# Patient Record
Sex: Male | Born: 1955 | Race: White | Hispanic: No | Smoking: Current every day smoker
Health system: Southern US, Community
[De-identification: ages and names within clinical notes are randomized; demographics above are authoritative.]

## PROBLEM LIST (undated history)

## (undated) DIAGNOSIS — E785 Hyperlipidemia, unspecified: Secondary | ICD-10-CM

## (undated) DIAGNOSIS — I2510 Atherosclerotic heart disease of native coronary artery without angina pectoris: Secondary | ICD-10-CM

## (undated) HISTORY — DX: Atherosclerotic heart disease of native coronary artery without angina pectoris: I25.10

## (undated) HISTORY — DX: Hyperlipidemia, unspecified: E78.5

## (undated) HISTORY — PX: TONSILLECTOMY AND ADENOIDECTOMY: SUR1326

---

## 2008-10-06 HISTORY — PX: HAND SURGERY: SHX662

## 2012-10-06 HISTORY — PX: CORONARY ARTERY BYPASS GRAFT: SHX141

## 2015-02-27 ENCOUNTER — Ambulatory Visit: Payer: Self-pay | Admitting: Internal Medicine

## 2015-03-07 ENCOUNTER — Telehealth: Payer: Self-pay | Admitting: Cardiology

## 2015-03-07 NOTE — Telephone Encounter (Signed)
Received records from [facility] for appointment with Dr Antoine PocheHochrein on 04/20/15.  [facility] ([facility] for Dr Hochrein's schedule on 04/20/15. lp

## 2015-04-20 ENCOUNTER — Ambulatory Visit (INDEPENDENT_AMBULATORY_CARE_PROVIDER_SITE_OTHER): Payer: BLUE CROSS/BLUE SHIELD | Admitting: Cardiology

## 2015-04-20 ENCOUNTER — Encounter: Payer: Self-pay | Admitting: Cardiology

## 2015-04-20 VITALS — BP 136/74 | HR 75 | Ht 73.0 in | Wt 226.0 lb

## 2015-04-20 DIAGNOSIS — Z951 Presence of aortocoronary bypass graft: Secondary | ICD-10-CM | POA: Diagnosis not present

## 2015-04-20 DIAGNOSIS — I2510 Atherosclerotic heart disease of native coronary artery without angina pectoris: Secondary | ICD-10-CM | POA: Diagnosis not present

## 2015-04-20 DIAGNOSIS — I25810 Atherosclerosis of coronary artery bypass graft(s) without angina pectoris: Secondary | ICD-10-CM | POA: Insufficient documentation

## 2015-04-20 MED ORDER — EZETIMIBE 10 MG PO TABS: 10.0000 mg | ORAL_TABLET | Freq: Every day | ORAL | Status: AC

## 2015-04-20 NOTE — Patient Instructions (Signed)
Your physician wants you to follow-up in: 18 Months. You will receive a reminder letter in the mail two months in advance. If you don't receive a letter, please call our office to schedule the follow-up appointment.  Your physician has requested that you have an exercise tolerance test. For further information please visit [email or web address] Please also follow instruction sheet, as given.  Your physician has recommended you make the following change in your medication: START Zetia 10 mg daily

## 2015-04-20 NOTE — Progress Notes (Signed)
Cardiology Office Note   Date:  04/20/2015   ID:  Rani Sisney, DOB 12-Mar-1956, MRN [id number]  PCP:  Gwen Pounds, MD  Cardiologist:   Rollene Rotunda, MD   Chief Complaint  Patient presents with  . Coronary Artery Disease      History of Present Illness: Roy Phelps is a 59 y.o. male who presents for evaluation of coronary disease. He has a history of CABG. 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 He reports that he had some vague symptoms when he was living in the [place]. This subsequently led to a diagnosis of coronary disease and CABG. He's not had any testing since then. He actually denies any prior myocardial infarction. He denies any cardiovascular symptoms. He says he is very active and can put 100 pound tarp on his back and carry it up a ladder.  The patient denies any new symptoms such as chest discomfort, neck or arm discomfort. There has been no new shortness of breath, PND or orthopnea. There have been no reported palpitations, presyncope or syncope.  Past Medical History  Diagnosis Date  . CAD (coronary artery disease)     90% left main February 2014  . Hyperlipemia     Past Surgical History  Procedure Laterality Date  . Coronary artery bypass graft  2014    3 vessel (no details)  . Hand surgery    . Tonsillectomy and adenoidectomy       Current Outpatient Prescriptions  Medication Sig Dispense Refill  . aspirin EC 81 MG tablet Take 81 mg by mouth daily.    . CRESTOR 40 MG tablet Take 40 mg by mouth daily.  11  . ezetimibe (ZETIA) 10 MG tablet Take 1 tablet (10 mg total) by mouth daily. 30 tablet 6   No current facility-administered medications for this visit.    Allergies:   Review of patient's allergies indicates no known allergies.    Social History:  The patient  reports that he has been smoking Cigarettes.  He has a 92.5 pack-year smoking history. He does not have any smokeless tobacco history on file.   Family History:  The  patient's family history includes CAD (age of onset: 63) in his mother; Colon cancer in his mother; Hypertension in his mother; Lung cancer in his brother; Skin cancer in his brother.    ROS:  Please see the history of present illness.   Otherwise, review of systems are positive for constipation.   All other systems are reviewed and negative.    PHYSICAL EXAM: VS:  BP 136/74 mmHg  Pulse 75  Ht  (1.854 m)  Wt 226 lb (102.513 kg)  BMI 29.82 kg/m2 , BMI Body mass index is 29.82 kg/(m^2). GENERAL:  Well appearing HEENT:  Pupils equal round and reactive, fundi not visualized, oral mucosa unremarkable, edentulous NECK:  No jugular venous distention, waveform within normal limits, carotid upstroke brisk and symmetric, no bruits, no thyromegaly LYMPHATICS:  No cervical, inguinal adenopathy LUNGS:  Clear to auscultation bilaterally BACK:  No CVA tenderness CHEST:  Well healed sternotomy scar. HEART:  PMI not displaced or sustained,S1 and S2 within normal limits, no S3, no S4, no clicks, no rubs, no murmurs ABD:  Flat, positive bowel sounds normal in frequency in pitch, no bruits, no rebound, no guarding, no midline pulsatile mass, no hepatomegaly, no splenomegaly EXT:  2 plus pulses throughout, no edema, no cyanosis no clubbing SKIN:  No rashes no nodules NEURO:  Cranial  nerves II through XII grossly intact, motor grossly intact throughout [facility]:  Cognitively intact, oriented to person place and time    EKG:  EKG is ordered today. The ekg ordered today demonstrates sinus rhythm, rate 75, axis within normal limits, intervals within normal limits, premature ventricular contractions.   Recent Labs: No results found for requested labs within last 365 days.    Lipid Panel No results found for: CHOL, TRIG, HDL, CHOLHDL, VLDL, LDLCALC, LDLDIRECT    Wt Readings from Last 3 Encounters:  04/20/15 226 lb (102.513 kg)      Other studies Reviewed: Additional studies/ records that were  reviewed today include: Office records. Review of the above records demonstrates:  Please see elsewhere in the note.     ASSESSMENT AND PLAN:  CAD:  The patient has no ongoing symptoms. 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  TOBACCO ABUSE:  He has no intention of quitting cigarettes and we talked about this.  DYSLIPIDEMIA:   I reviewed the records from his primary office. Despite the Zetia 40 mg his most recent LDL was 129. I agree that he would Zetia he was given a prescription for this.  LUNG NODULE:  I discussed this with him. This is mentioned in Dr. Ferd Hibbsusso's notes.  He will further discuss this with him.  Current medicines are reviewed at length with the patient today.  The patient does not have concerns regarding medicines.  The following changes have been made:  As above  Labs/ tests ordered today include:   Orders Placed This Encounter  Procedures  . Exercise Tolerance Test  . EKG 12-Lead     Disposition:   FU with me in 18 months.     Signed, Rollene RotundaJames Reynolds Kittel, MD  04/20/2015 2:16 PM    [facility]

## 2015-04-23 ENCOUNTER — Telehealth (HOSPITAL_COMMUNITY): Payer: Self-pay | Admitting: *Deleted

## 2015-06-06 ENCOUNTER — Telehealth (HOSPITAL_COMMUNITY): Payer: Self-pay | Admitting: Radiology

## 2015-06-06 NOTE — Telephone Encounter (Signed)
Encounter complete. 

## 2015-06-07 ENCOUNTER — Telehealth (HOSPITAL_COMMUNITY): Payer: Self-pay | Admitting: Radiology

## 2015-06-07 NOTE — Telephone Encounter (Signed)
Encounter complete. 

## 2015-06-08 ENCOUNTER — Ambulatory Visit (HOSPITAL_COMMUNITY)
Admission: RE | Admit: 2015-06-08 | Discharge: 2015-06-08 | Disposition: A | Discharge status: Home / Self Care | Payer: BLUE CROSS/BLUE SHIELD | Source: Ambulatory Visit | Attending: Cardiology | Admitting: Cardiology

## 2015-06-08 DIAGNOSIS — I2510 Atherosclerotic heart disease of native coronary artery without angina pectoris: Secondary | ICD-10-CM | POA: Diagnosis not present

## 2015-06-08 DIAGNOSIS — Z951 Presence of aortocoronary bypass graft: Secondary | ICD-10-CM | POA: Diagnosis not present

## 2015-06-08 LAB — EXERCISE TOLERANCE TEST
CHL CUP MPHR: 161 {beats}/min
CHL CUP RESTING HR STRESS: 69 {beats}/min
CSEPED: 3 min
CSEPEDS: 38 s
CSEPEW: 5.3 METS
CSEPPHR: 101 {beats}/min
Percent HR: 62 %
RPE: 16

## 2015-06-12 ENCOUNTER — Telehealth: Payer: Self-pay | Admitting: *Deleted

## 2015-06-12 DIAGNOSIS — I25810 Atherosclerosis of coronary artery bypass graft(s) without angina pectoris: Secondary | ICD-10-CM

## 2015-06-12 NOTE — Telephone Encounter (Signed)
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  Call Mr. Corporan with the results and send results to Gwen Pounds, MD

## 2015-06-12 NOTE — Telephone Encounter (Signed)
Spoke with pt about his stress test, placed order for a lexiscan Myoview, pt stated he wouldn't be about to do it until next month, message send to scheduler  Result send to pt PCP

## 2015-07-04 ENCOUNTER — Telehealth (HOSPITAL_COMMUNITY): Payer: Self-pay

## 2015-07-04 NOTE — Telephone Encounter (Signed)
Encounter complete. 

## 2015-07-06 ENCOUNTER — Ambulatory Visit (HOSPITAL_COMMUNITY)
Admission: RE | Admit: 2015-07-06 | Discharge: 2015-07-06 | Disposition: A | Discharge status: Home / Self Care | Payer: BLUE CROSS/BLUE SHIELD | Source: Ambulatory Visit | Attending: Cardiology | Admitting: Cardiology

## 2015-07-06 ENCOUNTER — Encounter: Payer: Self-pay | Admitting: *Deleted

## 2015-07-06 DIAGNOSIS — R0609 Other forms of dyspnea: Secondary | ICD-10-CM | POA: Diagnosis not present

## 2015-07-06 DIAGNOSIS — I25810 Atherosclerosis of coronary artery bypass graft(s) without angina pectoris: Secondary | ICD-10-CM | POA: Diagnosis not present

## 2015-07-06 DIAGNOSIS — I517 Cardiomegaly: Secondary | ICD-10-CM | POA: Insufficient documentation

## 2015-07-06 DIAGNOSIS — Z8249 Family history of ischemic heart disease and other diseases of the circulatory system: Secondary | ICD-10-CM | POA: Diagnosis not present

## 2015-07-06 DIAGNOSIS — F17200 Nicotine dependence, unspecified, uncomplicated: Secondary | ICD-10-CM | POA: Diagnosis not present

## 2015-07-06 DIAGNOSIS — R9439 Abnormal result of other cardiovascular function study: Secondary | ICD-10-CM | POA: Diagnosis not present

## 2015-07-06 LAB — MYOCARDIAL PERFUSION IMAGING
CHL CUP NUCLEAR SDS: 0
CHL CUP NUCLEAR SRS: 3
CHL CUP RESTING HR STRESS: 65 {beats}/min
LV sys vol: 80 mL
LVDIAVOL: 151 mL
NUC STRESS TID: 1.31
Peak HR: 93 {beats}/min
SSS: 3

## 2015-07-06 MED ORDER — TECHNETIUM TC 99M SESTAMIBI GENERIC - CARDIOLITE
10.9000 | Freq: Once | INTRAVENOUS | Status: AC | PRN
  Administered 2015-07-06: 10.9 via INTRAVENOUS

## 2015-07-06 MED ORDER — TECHNETIUM TC 99M SESTAMIBI GENERIC - CARDIOLITE
30.8000 | Freq: Once | INTRAVENOUS | Status: AC | PRN
  Administered 2015-07-06: 30.8 via INTRAVENOUS

## 2015-07-06 MED ORDER — AMINOPHYLLINE 25 MG/ML IV SOLN
75.0000 mg | Freq: Once | INTRAVENOUS | Status: AC
  Administered 2015-07-06: 75 mg via INTRAVENOUS

## 2015-07-06 MED ORDER — REGADENOSON 0.4 MG/5ML IV SOLN
0.4000 mg | Freq: Once | INTRAVENOUS | Status: AC
  Administered 2015-07-06: 0.4 mg via INTRAVENOUS

## 2015-07-17 ENCOUNTER — Telehealth: Payer: Self-pay | Admitting: Cardiology

## 2015-07-17 NOTE — Telephone Encounter (Signed)
Results routed to Dr. Timothy Lasso.

## 2015-07-17 NOTE — Telephone Encounter (Signed)
Pt would like his stress test results from 07-06-15 please. Please call and send it by e-mail if possible.

## 2015-07-17 NOTE — Telephone Encounter (Signed)
Goes to VM - left message to call back.

## 2015-07-18 NOTE — Telephone Encounter (Signed)
Pt aware of results, mailed to patient.

## 2016-01-31 ENCOUNTER — Ambulatory Visit
Admission: RE | Admit: 2016-01-31 | Discharge: 2016-01-31 | Disposition: A | Discharge status: Home / Self Care | Payer: Worker's Compensation | Source: Ambulatory Visit | Attending: Family | Admitting: Family

## 2016-01-31 ENCOUNTER — Other Ambulatory Visit: Payer: Self-pay | Admitting: Family

## 2016-01-31 DIAGNOSIS — M25561 Pain in right knee: Secondary | ICD-10-CM

## 2016-01-31 DIAGNOSIS — M79671 Pain in right foot: Secondary | ICD-10-CM | POA: Insufficient documentation

## 2016-01-31 DIAGNOSIS — M25571 Pain in right ankle and joints of right foot: Secondary | ICD-10-CM | POA: Diagnosis present

## 2016-01-31 DIAGNOSIS — M7989 Other specified soft tissue disorders: Secondary | ICD-10-CM | POA: Insufficient documentation

## 2016-01-31 DIAGNOSIS — M79604 Pain in right leg: Secondary | ICD-10-CM

## 2016-01-31 DIAGNOSIS — M79661 Pain in right lower leg: Secondary | ICD-10-CM | POA: Insufficient documentation

## 2016-01-31 DIAGNOSIS — X501XXA Overexertion from prolonged static or awkward postures, initial encounter: Secondary | ICD-10-CM | POA: Insufficient documentation

## 2017-06-14 IMAGING — CR DG TIBIA/FIBULA 2V*R*
1 series · 4 of 4 positions shown · non-contrast
Comparison: None.

CLINICAL DATA: Fall, pain in the right knee to the right foot,
initial encounter.

EXAM:
RIGHT TIBIA AND FIBULA - 2 VIEW

[Series 1: dg tibia/fibula right · 0.14mm/px · 4 of 4 slices shown]
[im 1/4]
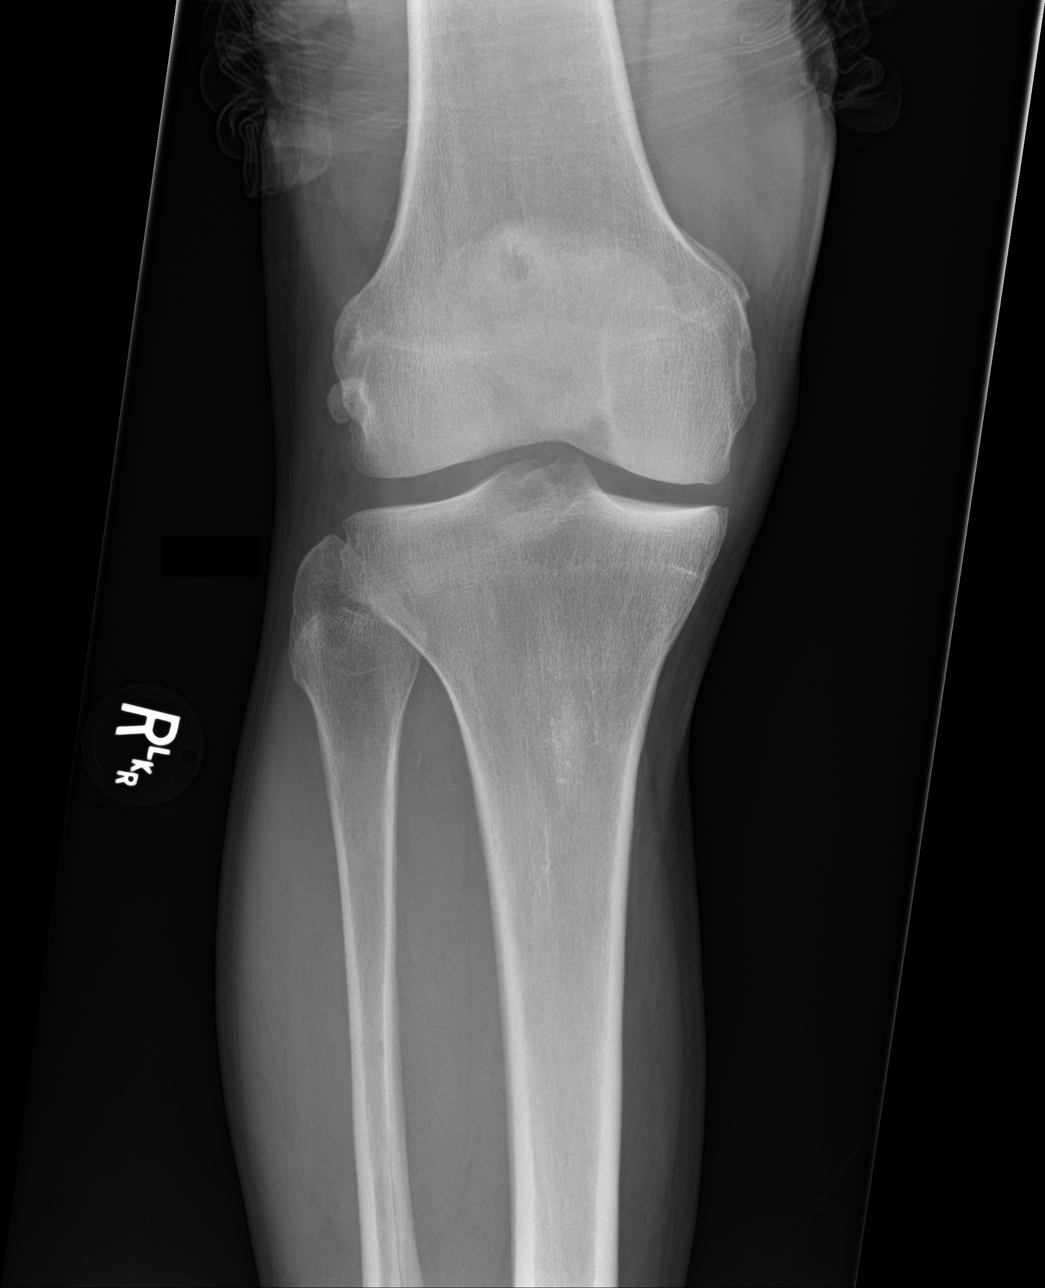
[im 2/4]
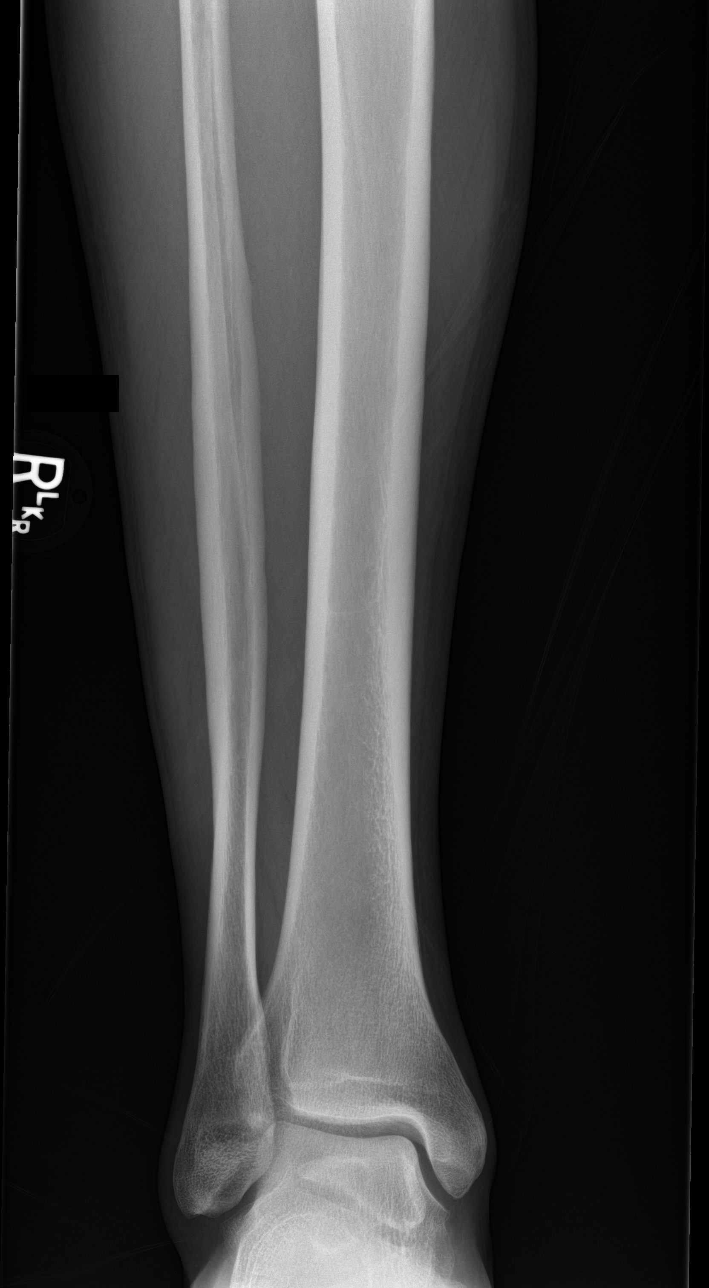
[im 3/4]
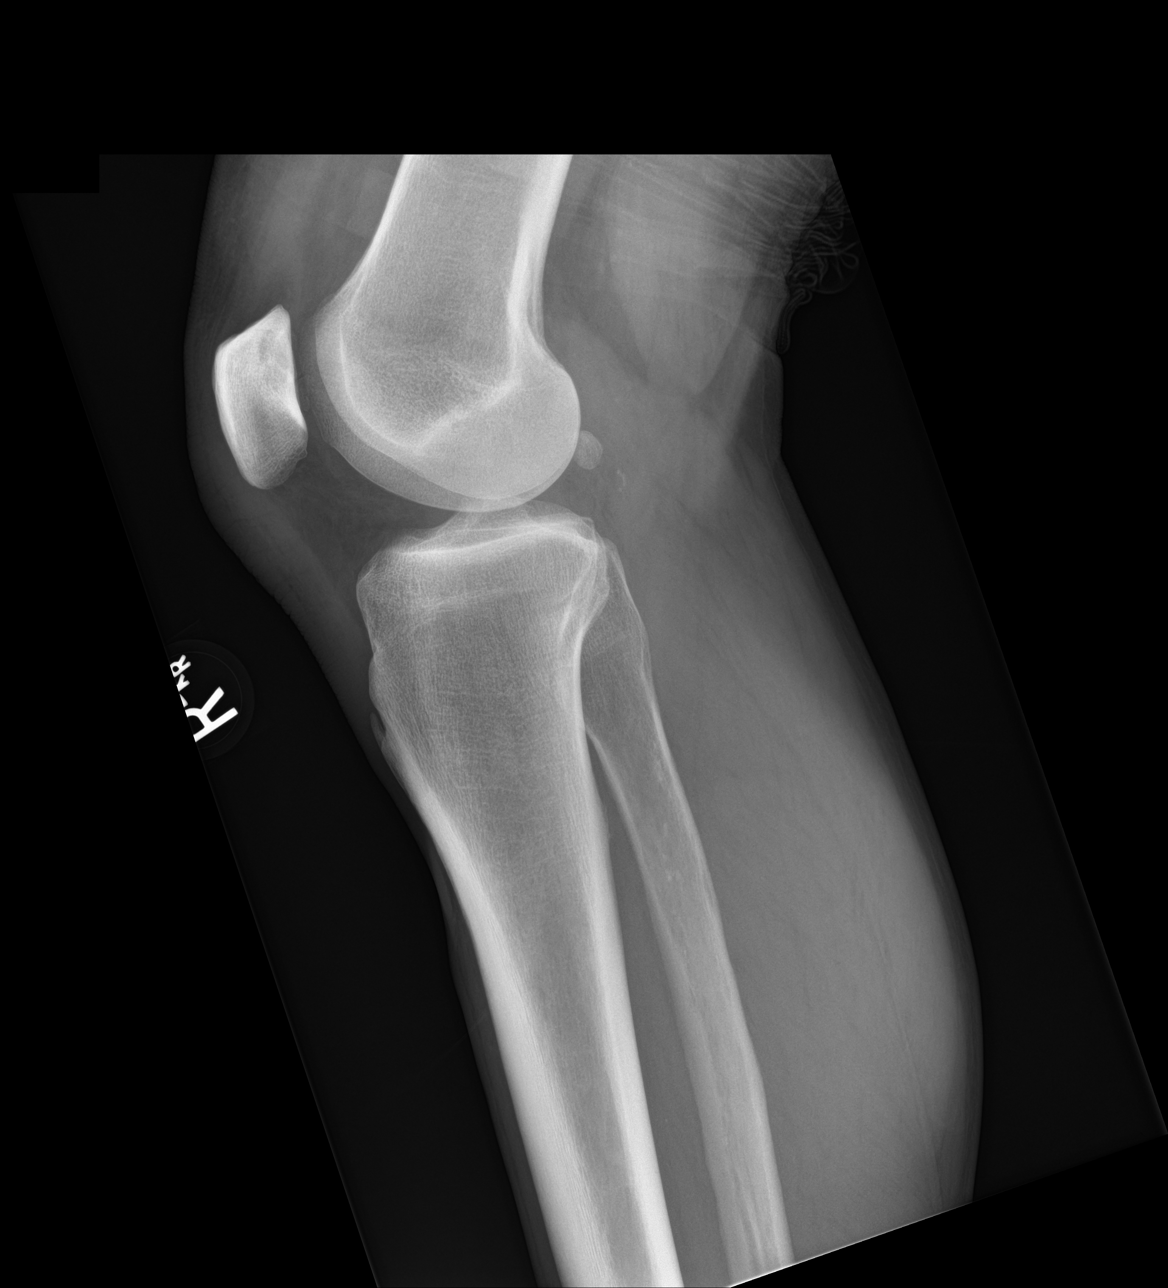
[im 4/4]
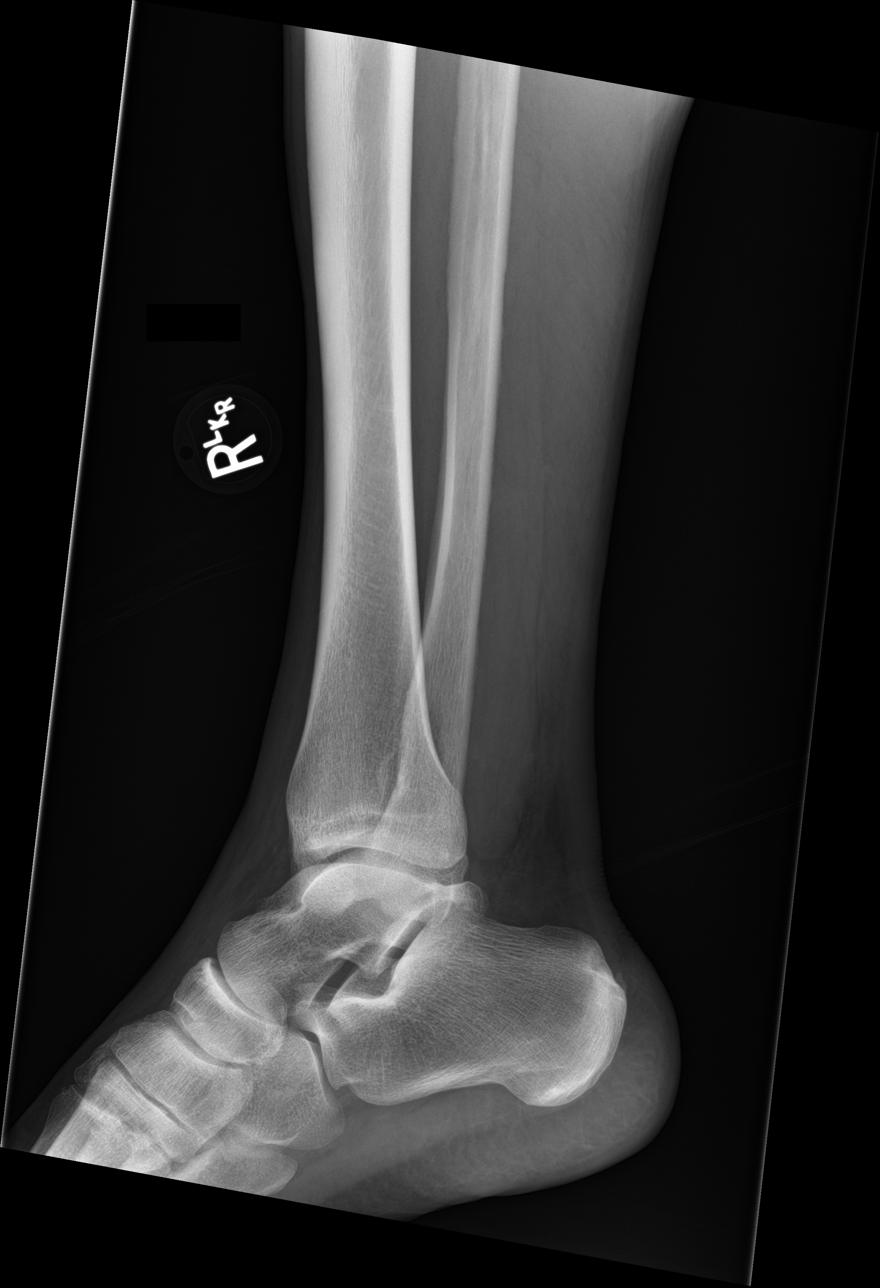

[4 of 4 positions shown; findings below may reference images not displayed]

FINDINGS: No joint effusion or fracture. Prepatellar soft tissue swelling. No
degenerative changes.
IMPRESSION: Prepatellar soft tissue swelling.  No fracture.

## 2017-06-14 IMAGING — CR DG FOOT COMPLETE 3+V*R*
1 series · 3 of 3 positions shown · non-contrast
Comparison: None.

CLINICAL DATA: Fall, pain in the right knee to the right foot,
initial encounter.

EXAM:
RIGHT FOOT COMPLETE - 3+ VIEW

[Series 1: dg foot complete right · 0.14mm/px · 3 of 3 slices shown]
[im 1/3]
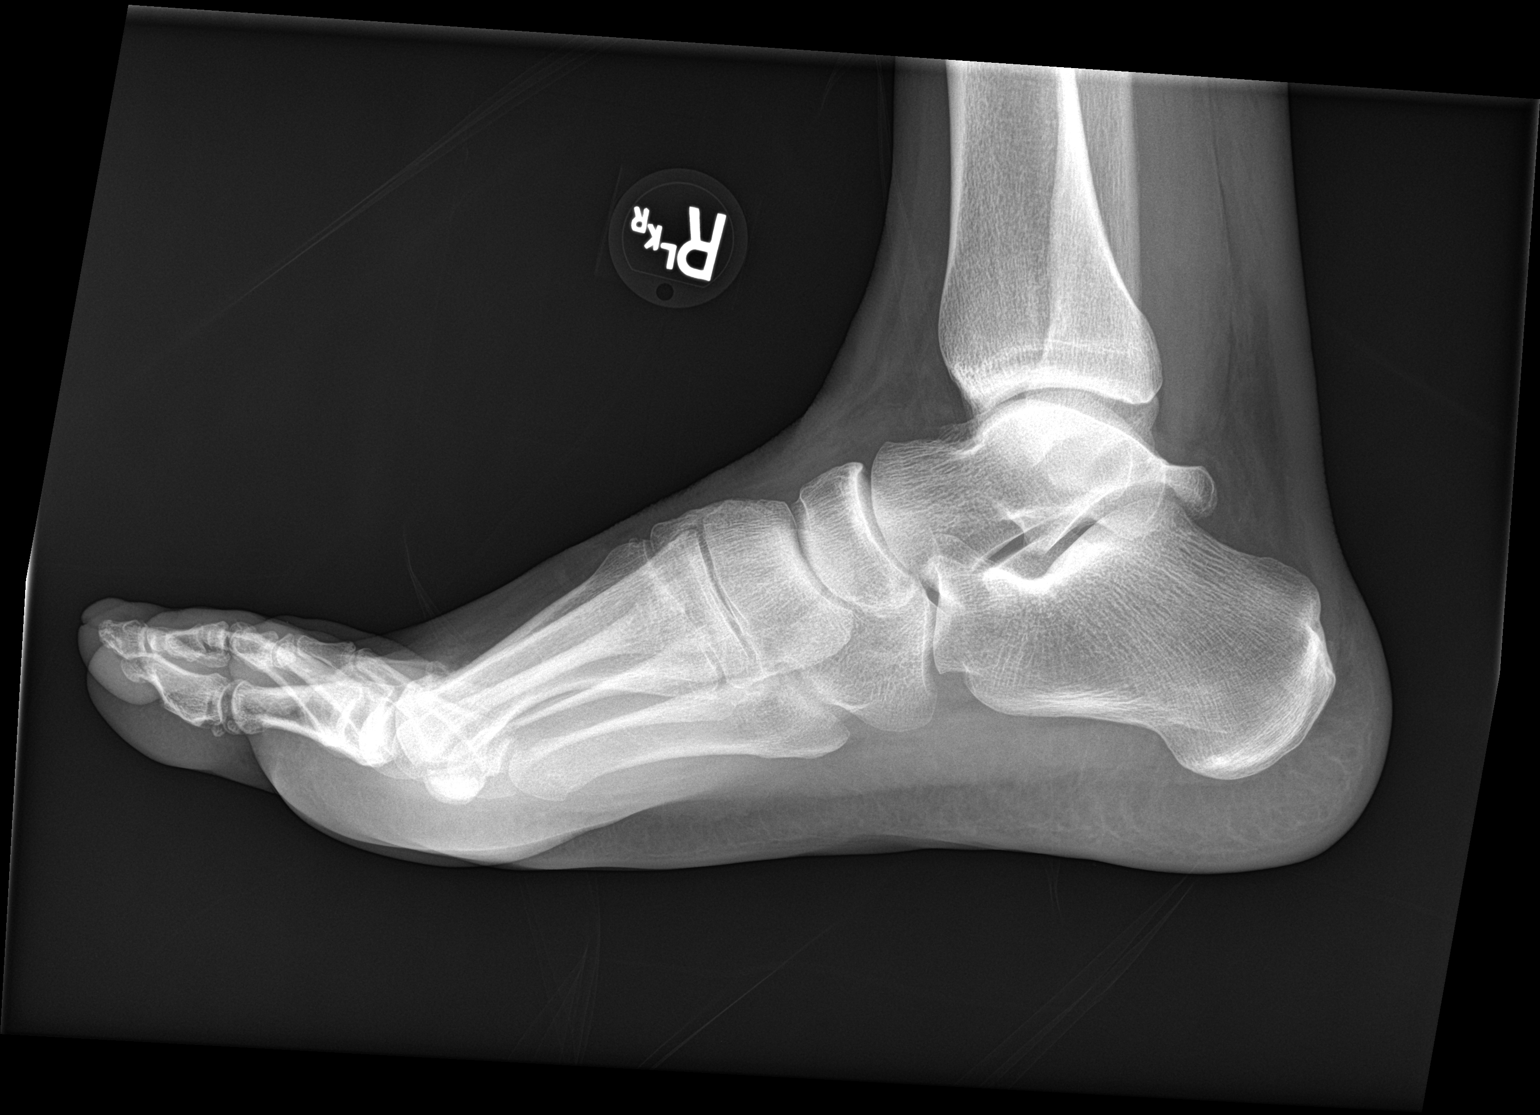
[im 2/3]
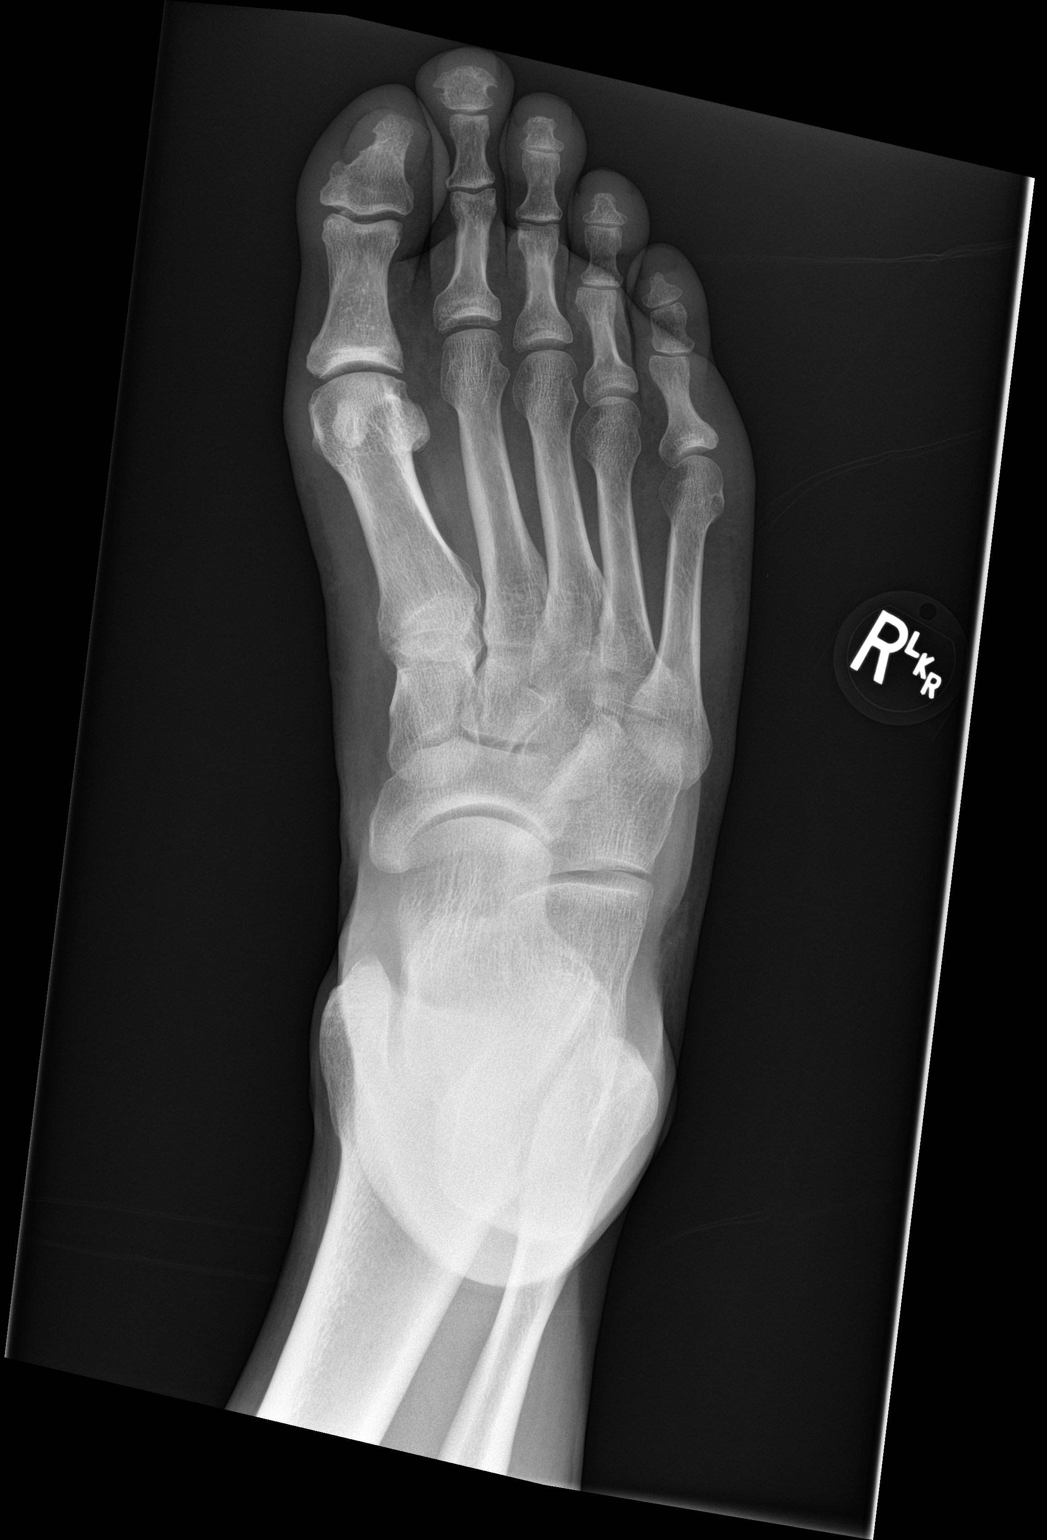
[im 3/3]
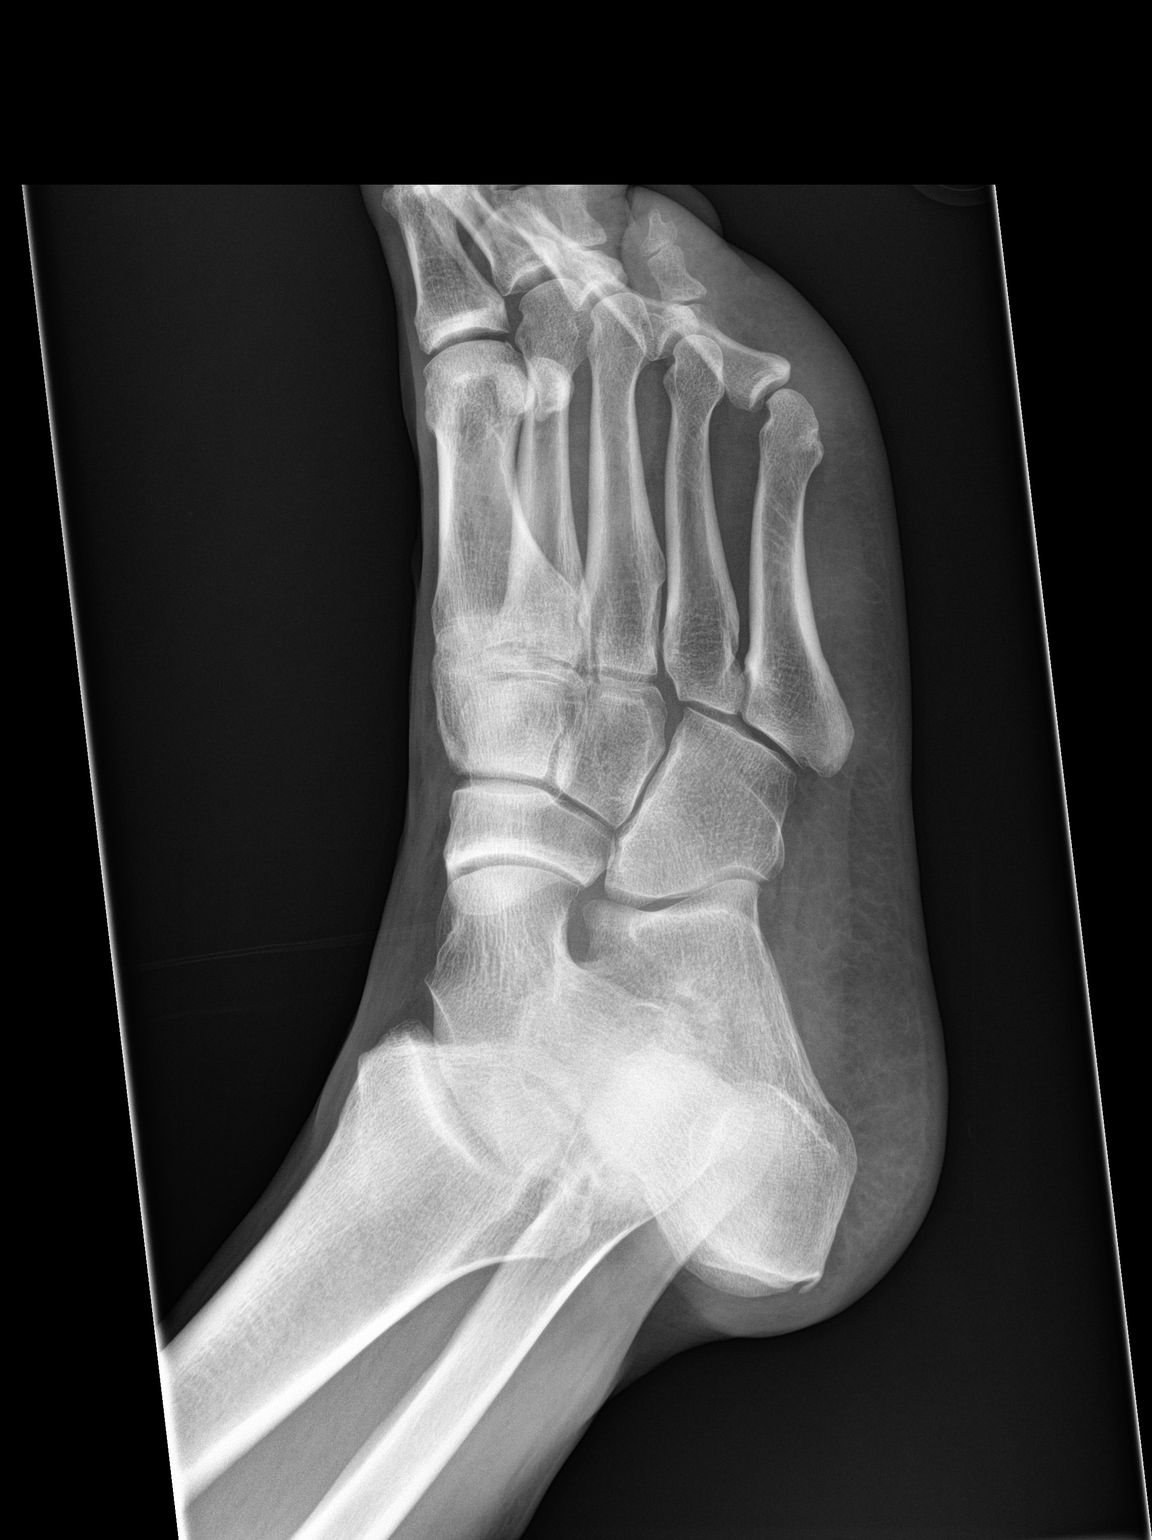

[3 of 3 positions shown; findings below may reference images not displayed]

FINDINGS: Mild hallux valgus.  No acute osseous or joint abnormality.
IMPRESSION: No acute osseous abnormality.

## 2017-06-14 IMAGING — CR DG KNEE COMPLETE 4+V*R*
1 series · 4 of 4 positions shown · non-contrast
Comparison: None.

CLINICAL DATA: Fell off a trailer.  Right knee pain.

EXAM:
RIGHT KNEE - COMPLETE 4+ VIEW

[Series 1: dg knee complete 4 views right · 0.14mm/px · 4 of 4 slices shown]
[im 1/4]
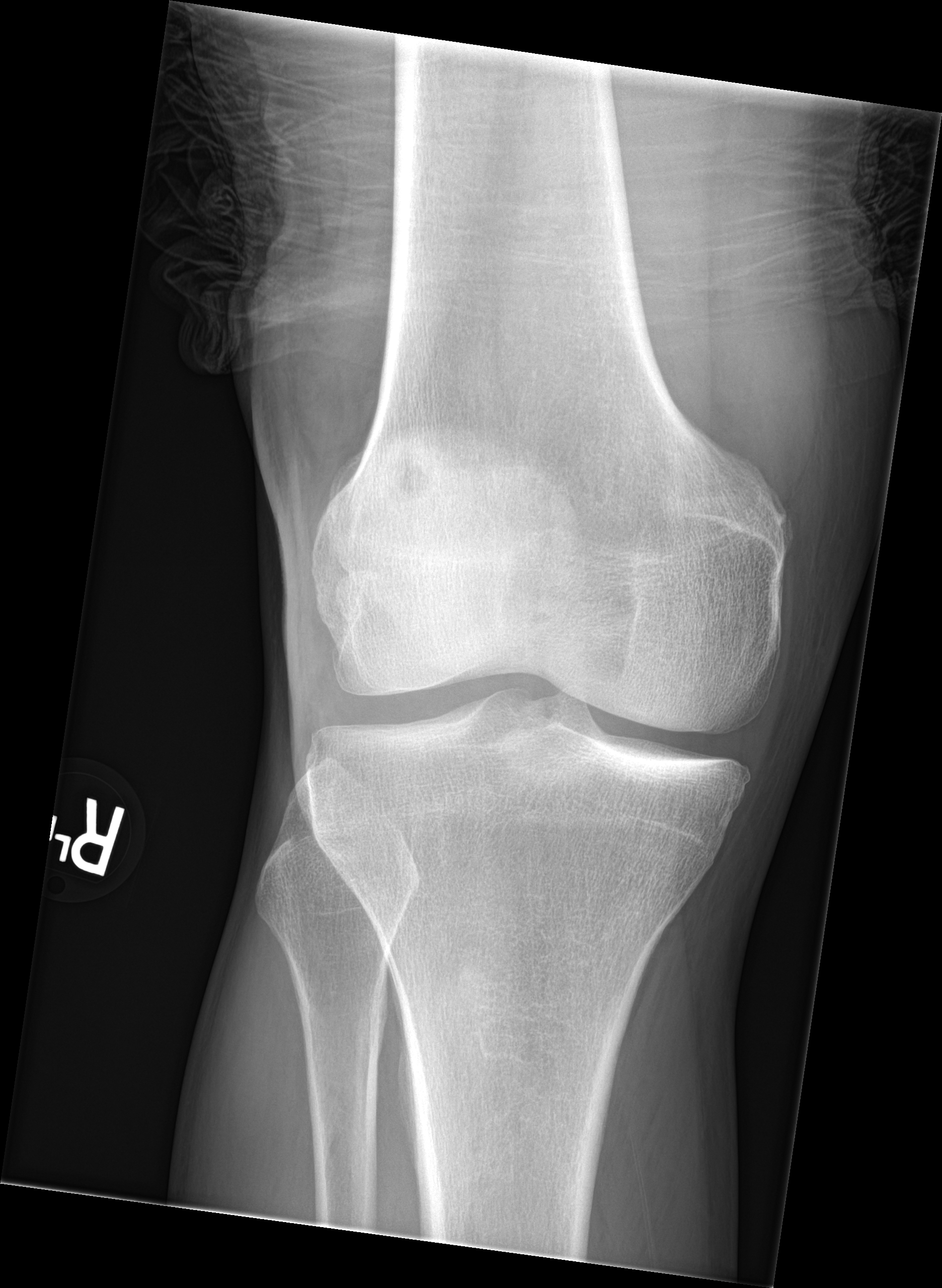
[im 2/4]
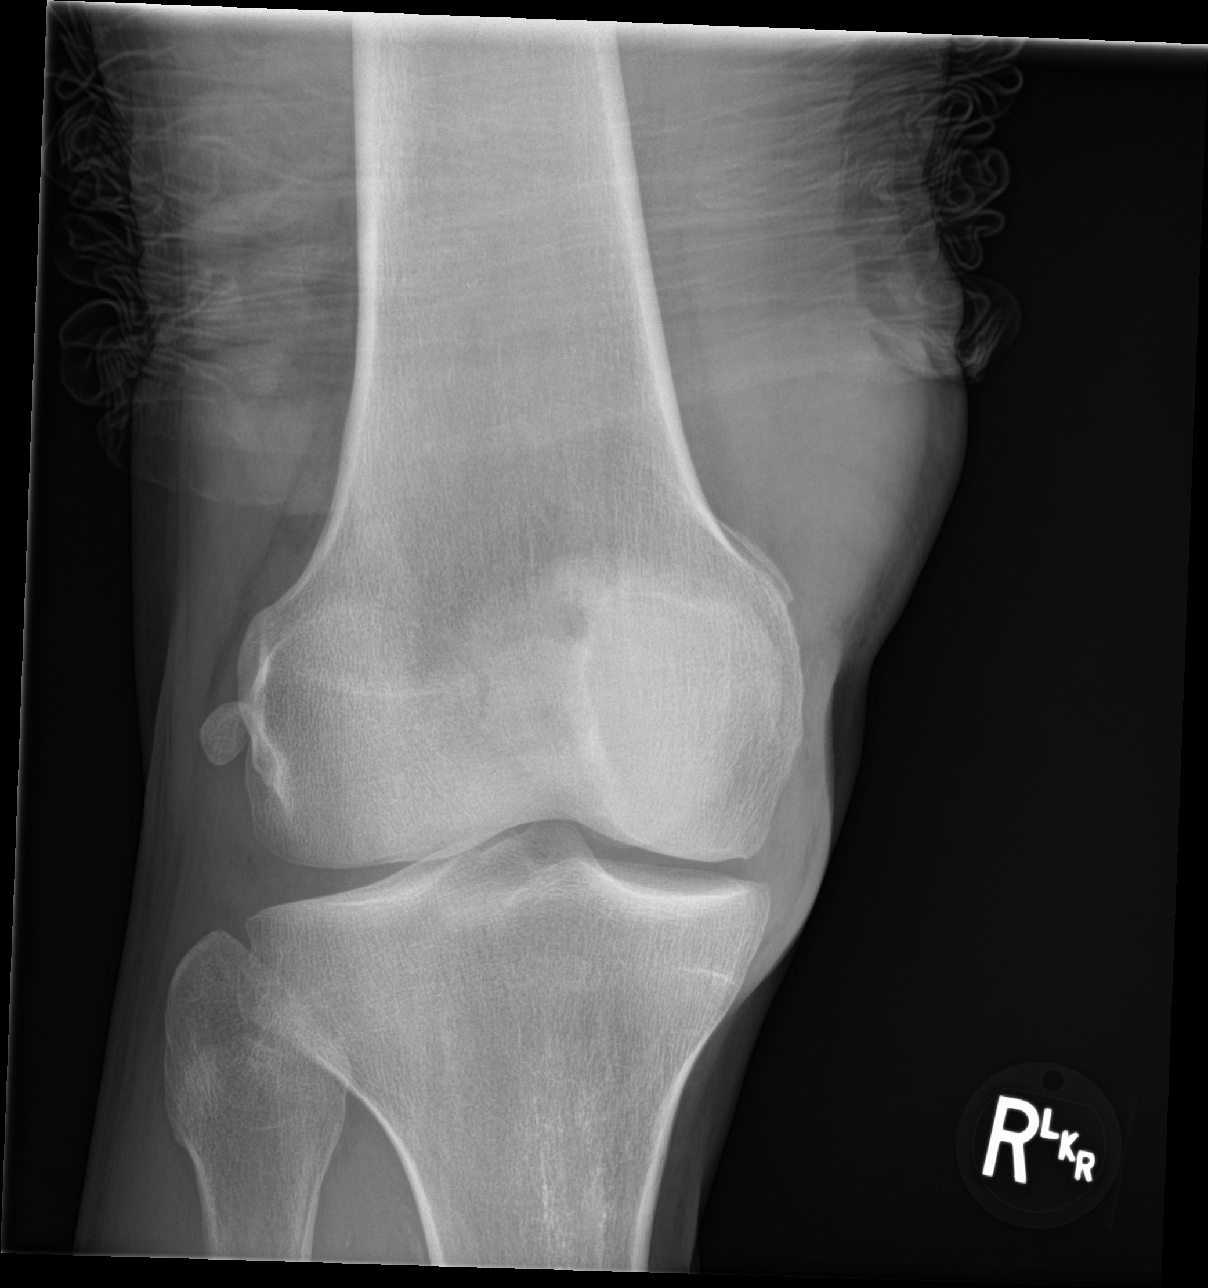
[im 3/4]
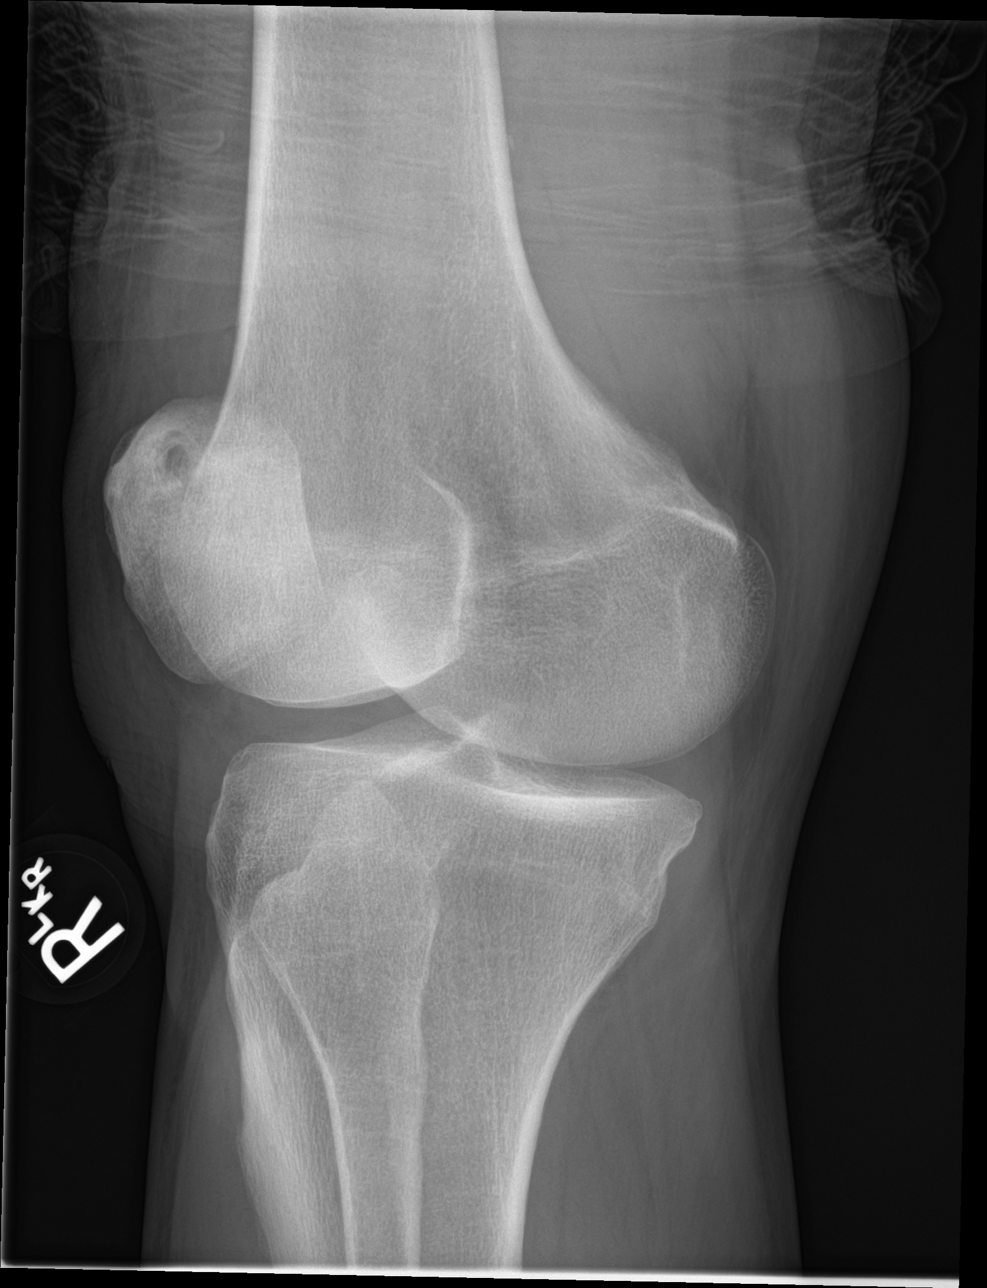
[im 4/4]
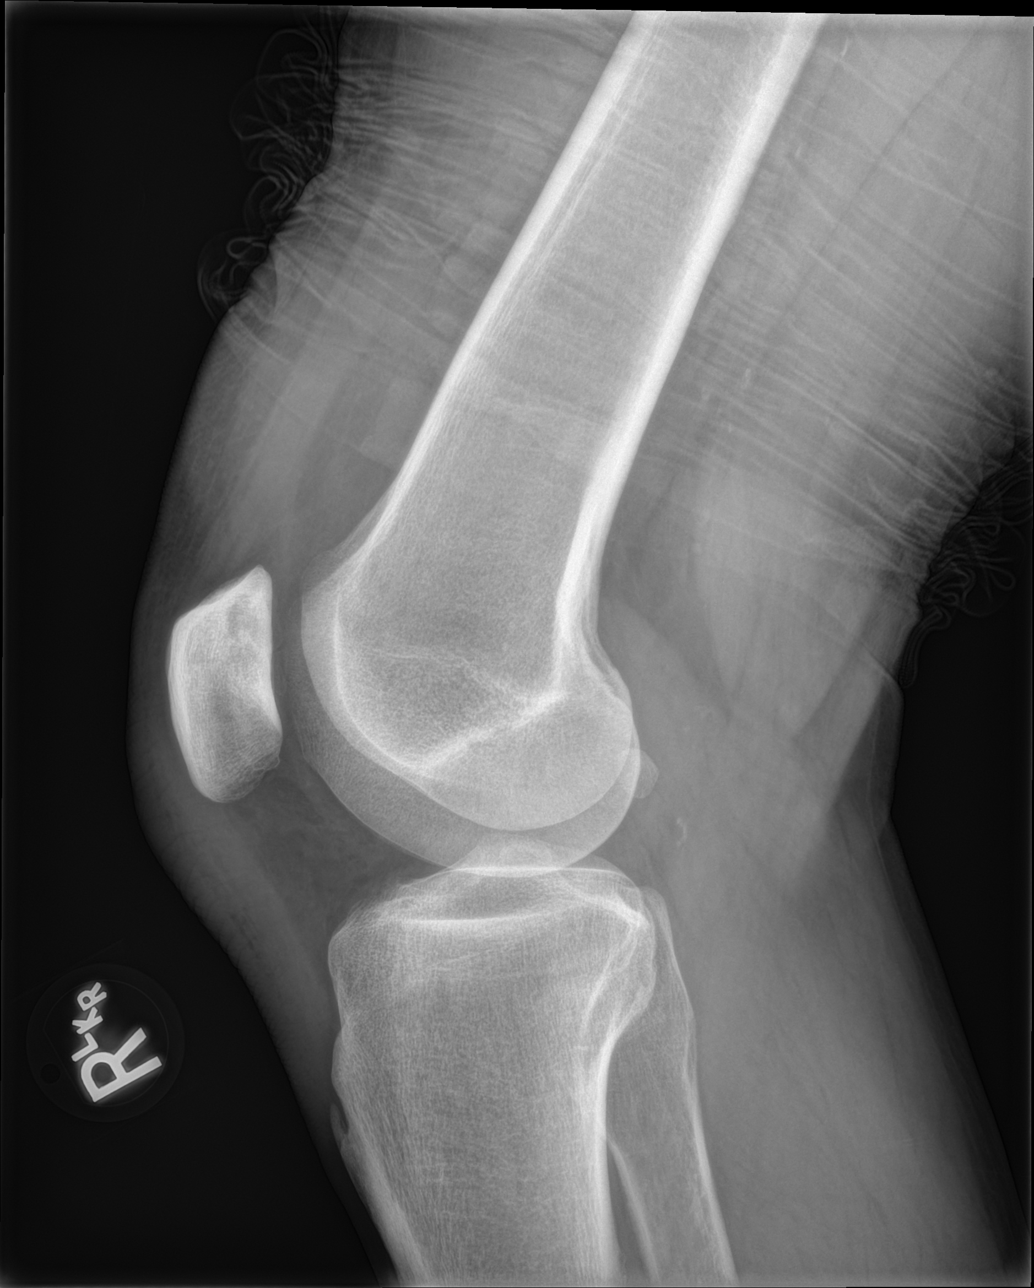

[4 of 4 positions shown; findings below may reference images not displayed]

FINDINGS: There is no evidence of fracture, dislocation, or joint effusion.
There is no evidence of arthropathy or other focal bone abnormality.
Lucency along the upper outer patella likely reflecting a dorsal
defect of the patella. Soft tissues are unremarkable.
IMPRESSION: No acute osseous injury of the right knee.

## 2022-02-03 DEATH — deceased
